# Patient Record
Sex: Male | Born: 1955 | Race: White | Hispanic: No | Marital: Married
Health system: Midwestern US, Academic
[De-identification: ages and names within clinical notes are randomized; demographics above are authoritative.]

---

## 2017-04-30 ENCOUNTER — Encounter: Admit: 2017-04-30 | Discharge: 2017-04-30 | Payer: BC Managed Care – PPO

## 2017-04-30 ENCOUNTER — Ambulatory Visit: Admit: 2017-04-30 | Discharge: 2017-04-30 | Payer: 59

## 2017-04-30 DIAGNOSIS — N486 Induration penis plastica: Principal | ICD-10-CM

## 2017-04-30 DIAGNOSIS — T148XXA Other injury of unspecified body region, initial encounter: Principal | ICD-10-CM

## 2017-04-30 DIAGNOSIS — I10 Essential (primary) hypertension: ICD-10-CM

## 2017-04-30 MED ORDER — VITAMIN E (DL, ACETATE) 400 UNIT PO CAP
400 [IU] | ORAL_CAPSULE | Freq: Every day | ORAL | 6 refills | Status: AC
Start: 2017-04-30 — End: ?

## 2017-04-30 MED ORDER — PENTOXIFYLLINE 400 MG PO TBER
400 mg | ORAL_TABLET | Freq: Three times a day (TID) | ORAL | 6 refills | 30.00000 days | Status: AC
Start: 2017-04-30 — End: ?

## 2017-04-30 MED ORDER — SILDENAFIL 20 MG PO TAB
10 mg | ORAL_TABLET | ORAL | 3 refills | 25.00000 days | Status: AC | PRN
Start: 2017-04-30 — End: ?

## 2017-04-30 MED ORDER — LEVOCARNITINE 500 MG PO TAB
1000 mg | ORAL_TABLET | Freq: Two times a day (BID) | ORAL | 6 refills | 30.00000 days | Status: AC
Start: 2017-04-30 — End: ?

## 2017-04-30 NOTE — Progress Notes
Date of Service: 04/30/2017    Subjective:             Thomas Wade is a 61 y.o. male.    History of Present Illness  61 y.o. male referred by Lona Kettle for penile curvature.  Has had this problem for the past year, worsening about 4-5 months ago when he developed painful erections.  Pain has decreased.  Brings in photos that seem to demonstrate 45-50 degrees of curvature.  Symptoms worsened gradually.  Does not recall trauma.  Not currently on therapy.  No prior treatments.  No depuytrens     Patient???s penis is described as the following:  Curvature worsened over time: Yes  Curvature stable: Yes  Curvature: Dorsal and to the left     Penis shortening: Yes  Hinge defect: Yes  Hour glass deformity:  No  Grade of erection: 5  Difficulty maintaining erection: Yes  Capable of intercourse: Yes but difficult  Pain during sexual intercourse: No  Partner pain during sexual intercourse: No  Difficulty with penetration: hinge effect and lack of firmness  Libido: normal    Family history:  Dupuytren???s: No  Lederhose: No  Other scarring disorders: No      Past Medical History:   Diagnosis Date   ??? Fracture    ??? Hypertension        Past Surgical History:   Procedure Laterality Date   ??? ANKLE SURGERY Right    ??? HEMORRHOIDECTOMY     ??? HX VASECTOMY     ??? KNEE SURGERY Left        Family History   Problem Relation Age of Onset   ??? Cancer Mother    ??? Hypertension Father    ??? Hypertension Sister    ??? Hypertension Brother        Social History     Social History   ??? Marital status: Married     Spouse name: N/A   ??? Number of children: N/A   ??? Years of education: N/A     Occupational History   ??? Not on file.     Social History Main Topics   ??? Smoking status: Never Smoker   ??? Smokeless tobacco: Never Used   ??? Alcohol use 0.5 oz/week     1 Standard drinks or equivalent per week   ??? Drug use: No   ??? Sexual activity: Yes     Partners: Female     Other Topics Concern   ??? Not on file     Social History Narrative   ??? No narrative on file No Known Allergies         Review of Systems   Constitutional: Negative for activity change, appetite change, chills, diaphoresis, fatigue, fever and unexpected weight change.   HENT: Negative for congestion, hearing loss, mouth sores and sinus pressure.    Eyes: Negative for visual disturbance.   Respiratory: Negative for apnea, cough, chest tightness and shortness of breath.    Cardiovascular: Negative for chest pain, palpitations and leg swelling.   Gastrointestinal: Negative for abdominal pain, blood in stool, constipation, diarrhea, nausea, rectal pain and vomiting.   Genitourinary: Negative for decreased urine volume, difficulty urinating, discharge, dysuria, enuresis, flank pain, frequency, genital sores, hematuria, penile pain, penile swelling, scrotal swelling, testicular pain and urgency.   Musculoskeletal: Negative for arthralgias, back pain and gait problem.   Skin: Negative for rash and wound.   Neurological: Negative for dizziness, tremors, syncope, weakness, light-headedness, numbness and headaches.  Hematological: Negative for adenopathy. Does not bruise/bleed easily.   Psychiatric/Behavioral: Negative for decreased concentration and dysphoric mood. The patient is not nervous/anxious.          Objective:         ??? lisinopril-hydrochlorothiazide (PRINZIDE, ZESTORETIC) 10-12.5 mg tablet Take 1 tablet by mouth every morning.   ??? NO HOME MEDICATIONS      Vitals:    04/30/17 0958   BP: 139/79   Pulse: 65   Weight: 110 kg (242 lb 6.4 oz)   Height: 177.8 cm (70)     Body mass index is 34.78 kg/m???.     Physical Exam   Constitutional: He is oriented to person, place, and time. He appears well-developed and well-nourished.   HENT:   Head: Normocephalic and atraumatic.   Eyes: Pupils are equal, round, and reactive to light.   Cardiovascular: Normal rate.    Pulmonary/Chest: Effort normal.   Abdominal: Soft. He exhibits no distension. There is no tenderness.   Genitourinary: Genitourinary Comments: ~60mm dorsal penile plaque palpable at base of penis   Musculoskeletal: Normal range of motion. He exhibits no edema or tenderness.   Neurological: He is alert and oriented to person, place, and time.   Skin: Skin is warm and dry.   Psychiatric: He has a normal mood and affect. His behavior is normal.   Vitals reviewed.           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  Discussed treatment options for Peyronie's disease.  We reviewed observation, oral medications, intralesional verapamil injection, Xiaflex, penile plication, incision and grafting, and penile prosthesis insertion.      He will try the peyronie's cocktail with vitamin E, pentoxifylline, and l-carnitine, along with a VED.  Recommended 6 months.  If no improvement, could consider Xiaflex.  F/u 6 months

## 2017-10-29 ENCOUNTER — Encounter: Admit: 2017-10-29 | Discharge: 2017-10-29 | Payer: BC Managed Care – PPO

## 2017-10-29 ENCOUNTER — Ambulatory Visit: Admit: 2017-10-29 | Discharge: 2017-10-29 | Payer: BC Managed Care – PPO

## 2017-10-29 DIAGNOSIS — T148XXA Other injury of unspecified body region, initial encounter: Principal | ICD-10-CM

## 2017-10-29 DIAGNOSIS — I10 Essential (primary) hypertension: ICD-10-CM

## 2017-10-29 DIAGNOSIS — N486 Induration penis plastica: Principal | ICD-10-CM

## 2017-10-29 MED ORDER — OTHER MEDICATION
RESPIRATORY_TRACT | 11 refills | 57.00000 days | Status: AC
Start: 2017-10-29 — End: 2017-10-29

## 2017-10-29 MED ORDER — OTHER MEDICATION
RESPIRATORY_TRACT | 11 refills | 57.00000 days | Status: AC
Start: 2017-10-29 — End: ?

## 2018-08-02 IMAGING — CT Upper Extremities^1_UPPER_EXT (Adult)
1 series · 12 of 14 positions shown, 15 images · non-contrast
Comparison: none

[Series 3: elbow rt 2.0 soft tissue · axial · 0.49mm/px · z∈[+48,+224]mm · 12 of 104 slices shown, 15 images]
[im 8/104  soft-tissue]
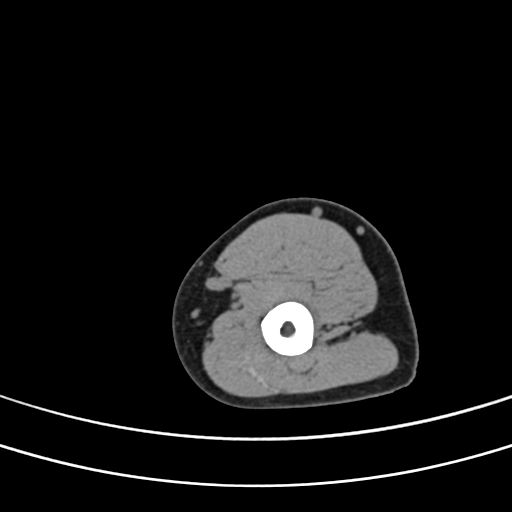
[im 8/104  bone]
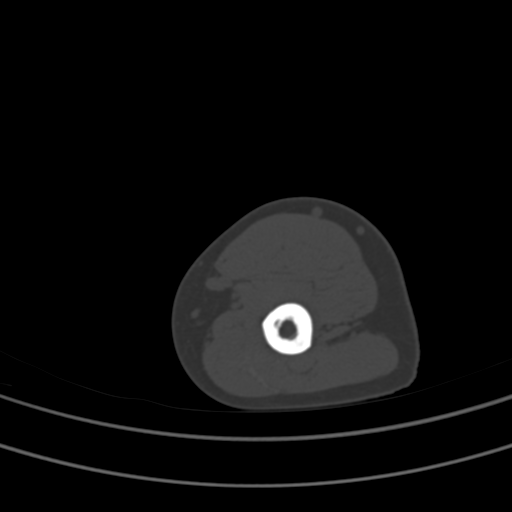
[im 16/104  bone]
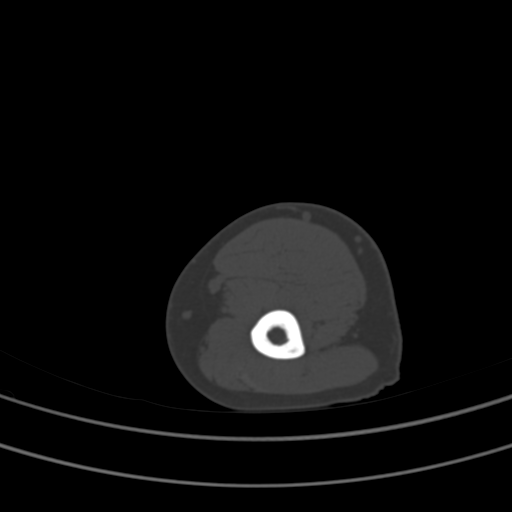
[im 24/104  bone]
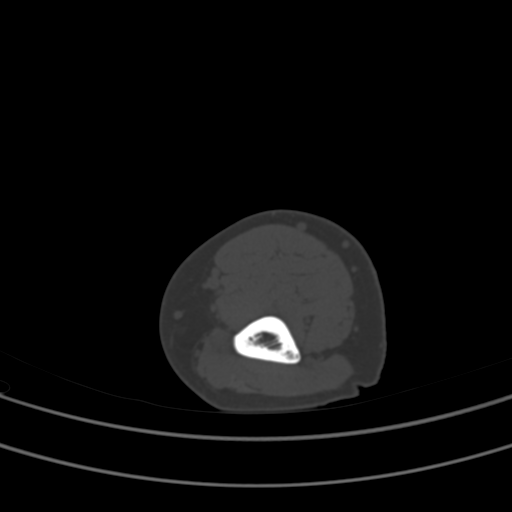
[im 32/104  bone]
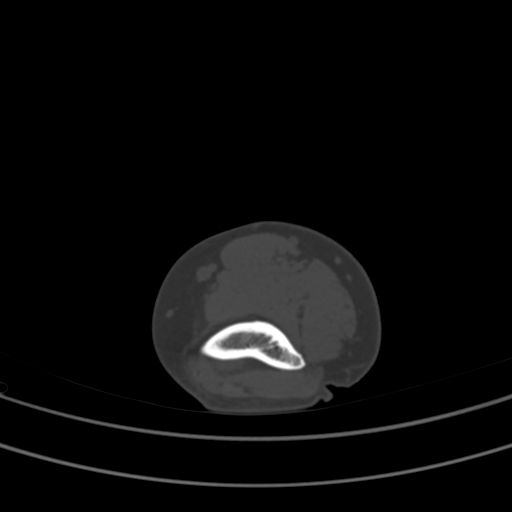
[im 40/104  soft-tissue]
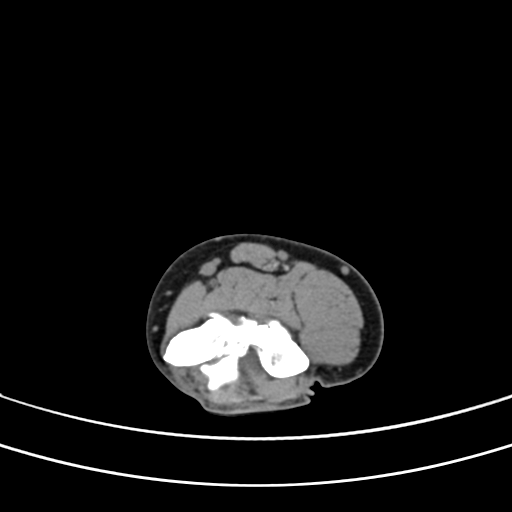
[im 40/104  bone]
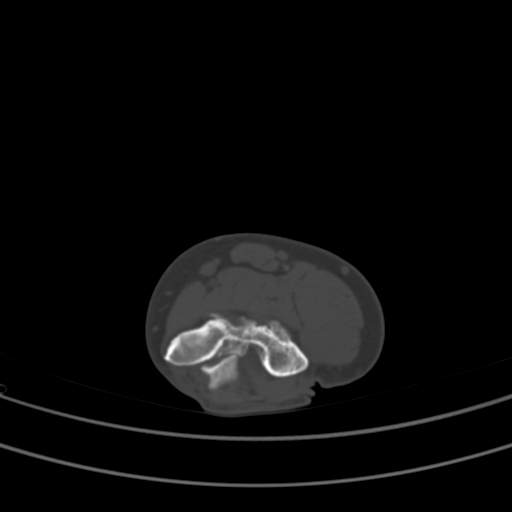
[im 48/104  bone]
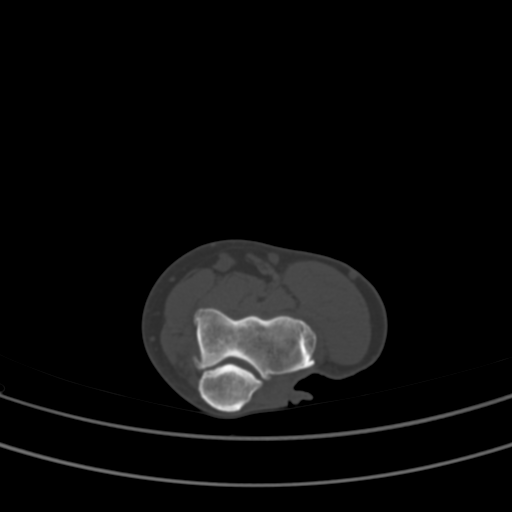
[im 56/104  bone]
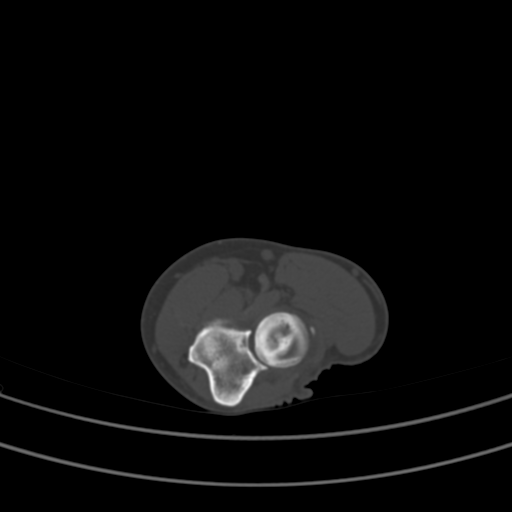
[im 64/104  bone]
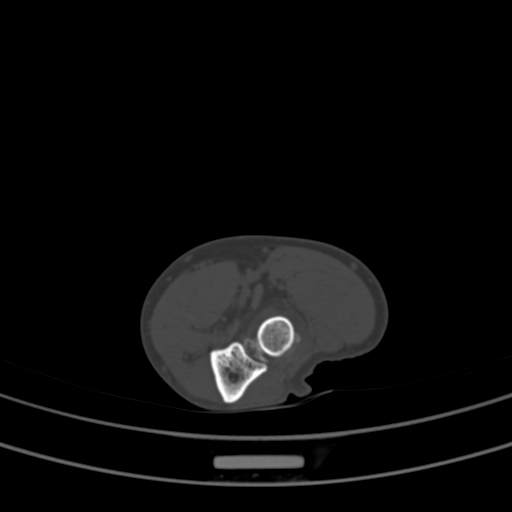
[im 72/104  soft-tissue]
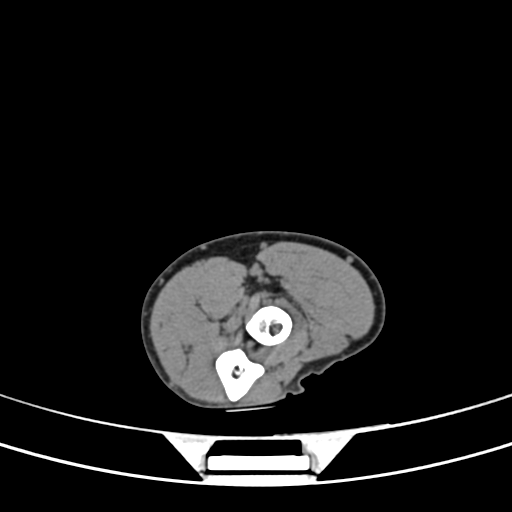
[im 72/104  bone]
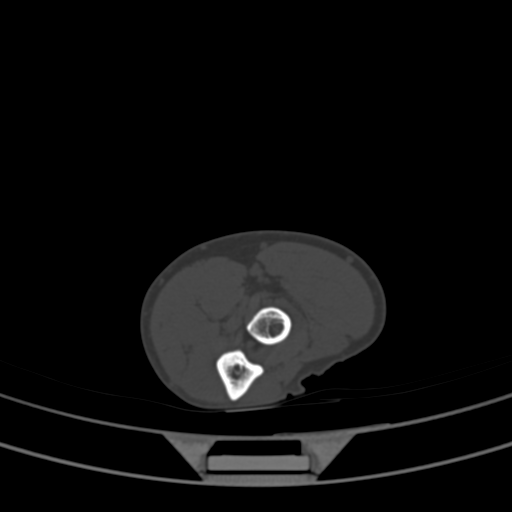
[im 80/104  bone]
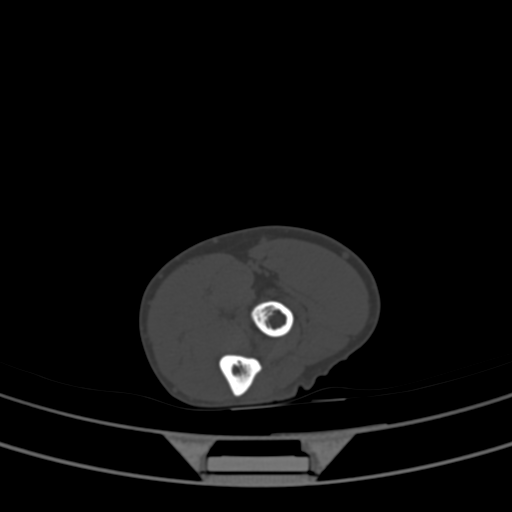
[im 88/104  bone]
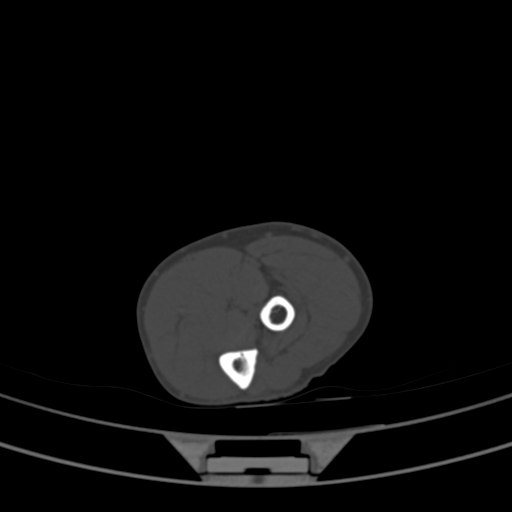
[im 96/104  bone]
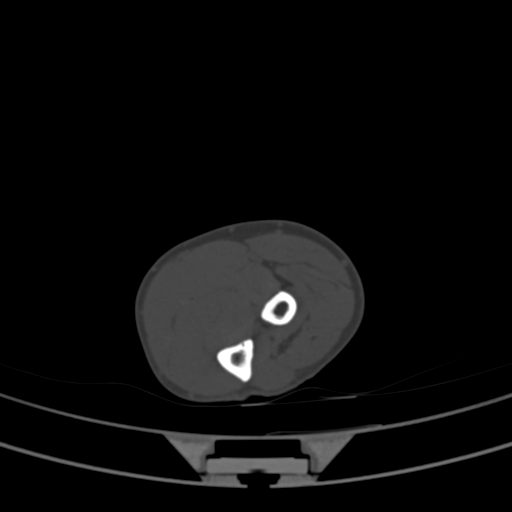

[12 of 14 positions shown; findings below may reference images not displayed]

DIAGNOSTIC STUDIES

EXAM
CT examination of the right elbow.

INDICATION
right elbow pain
PT C/O CHRONIC RIGHT ELBOW PAIN. NO RECENT INJURY. TJ

TECHNIQUE
All CT scans at this facility use dose modulation, iterative reconstruction, and/or weight based
dosing when appropriate to reduce radiation dose to as low as reasonably achievable.

COMPARISONS
None available

FINDINGS
Extensive loss cartilage is noted involving the radial and ulnar aspects of the elbow. There is
essentially bone-on-bone articulation involving the radial capitellum joint space with minimal
subchondral cyst formation and associated osteophytic spurring. Small elbow effusion is seen. There
are small calcified densities adjacent to the radial head (image 28 series 5 [REDACTED] dystrophic.
Spurring is also evident off the coronoid process. No fractures are seen.

IMPRESSION
Extensive osteoarthritic changes of the elbow with osteophytic spurring of the radial head,
capitellum, colon, and humerus. No fracture is seen.
Small effusion is noted.
Small sub 5 millimeter calcific densities adjacent to the radial head are felt to be dystrophic.]

Tech Notes:

PT C/O CHRONIC RIGHT ELBOW PAIN. NO RECENT INJURY. TJ
